# Patient Record
Sex: Female | Born: 2000 | Hispanic: No | Marital: Single | Smoking: Never smoker
Health system: Southern US, Community
[De-identification: ages and names within clinical notes are randomized; demographics above are authoritative.]

## PROBLEM LIST (undated history)

## (undated) DIAGNOSIS — S060X9A Concussion with loss of consciousness of unspecified duration, initial encounter: Secondary | ICD-10-CM

## (undated) DIAGNOSIS — D649 Anemia, unspecified: Secondary | ICD-10-CM

## (undated) DIAGNOSIS — F419 Anxiety disorder, unspecified: Secondary | ICD-10-CM

## (undated) DIAGNOSIS — R55 Syncope and collapse: Secondary | ICD-10-CM

## (undated) DIAGNOSIS — T7840XA Allergy, unspecified, initial encounter: Secondary | ICD-10-CM

## (undated) DIAGNOSIS — N83209 Unspecified ovarian cyst, unspecified side: Secondary | ICD-10-CM

## (undated) DIAGNOSIS — O24419 Gestational diabetes mellitus in pregnancy, unspecified control: Secondary | ICD-10-CM

## (undated) DIAGNOSIS — B999 Unspecified infectious disease: Secondary | ICD-10-CM

## (undated) DIAGNOSIS — E119 Type 2 diabetes mellitus without complications: Secondary | ICD-10-CM

## (undated) DIAGNOSIS — S060XAA Concussion with loss of consciousness status unknown, initial encounter: Secondary | ICD-10-CM

## (undated) HISTORY — DX: Allergy, unspecified, initial encounter: T78.40XA

## (undated) HISTORY — PX: NO PAST SURGERIES: SHX2092

---
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
## 2021-01-28 DIAGNOSIS — Z419 Encounter for procedure for purposes other than remedying health state, unspecified: Secondary | ICD-10-CM | POA: Diagnosis not present

## 2021-02-28 DIAGNOSIS — Z419 Encounter for procedure for purposes other than remedying health state, unspecified: Secondary | ICD-10-CM | POA: Diagnosis not present

## 2021-03-31 DIAGNOSIS — Z419 Encounter for procedure for purposes other than remedying health state, unspecified: Secondary | ICD-10-CM | POA: Diagnosis not present

## 2021-04-30 DIAGNOSIS — Z419 Encounter for procedure for purposes other than remedying health state, unspecified: Secondary | ICD-10-CM | POA: Diagnosis not present

## 2021-05-31 DIAGNOSIS — Z419 Encounter for procedure for purposes other than remedying health state, unspecified: Secondary | ICD-10-CM | POA: Diagnosis not present

## 2021-06-30 DIAGNOSIS — Z419 Encounter for procedure for purposes other than remedying health state, unspecified: Secondary | ICD-10-CM | POA: Diagnosis not present

## 2021-07-31 DIAGNOSIS — Z419 Encounter for procedure for purposes other than remedying health state, unspecified: Secondary | ICD-10-CM | POA: Diagnosis not present

## 2021-08-31 DIAGNOSIS — Z419 Encounter for procedure for purposes other than remedying health state, unspecified: Secondary | ICD-10-CM | POA: Diagnosis not present

## 2021-09-03 IMAGING — US US BREAST*R* LIMITED INC AXILLA
1 series · 5 of 5 positions shown · non-contrast
Comparison: Previous exam(s).

CLINICAL DATA: Six-month follow-up for a likely benign right breast
mass

EXAM:
ULTRASOUND OF THE RIGHT BREAST

[Series 1: us breast*right* limited inc axilla · 0.07mm/px · 5 of 5 slices shown]
[im 1/5]
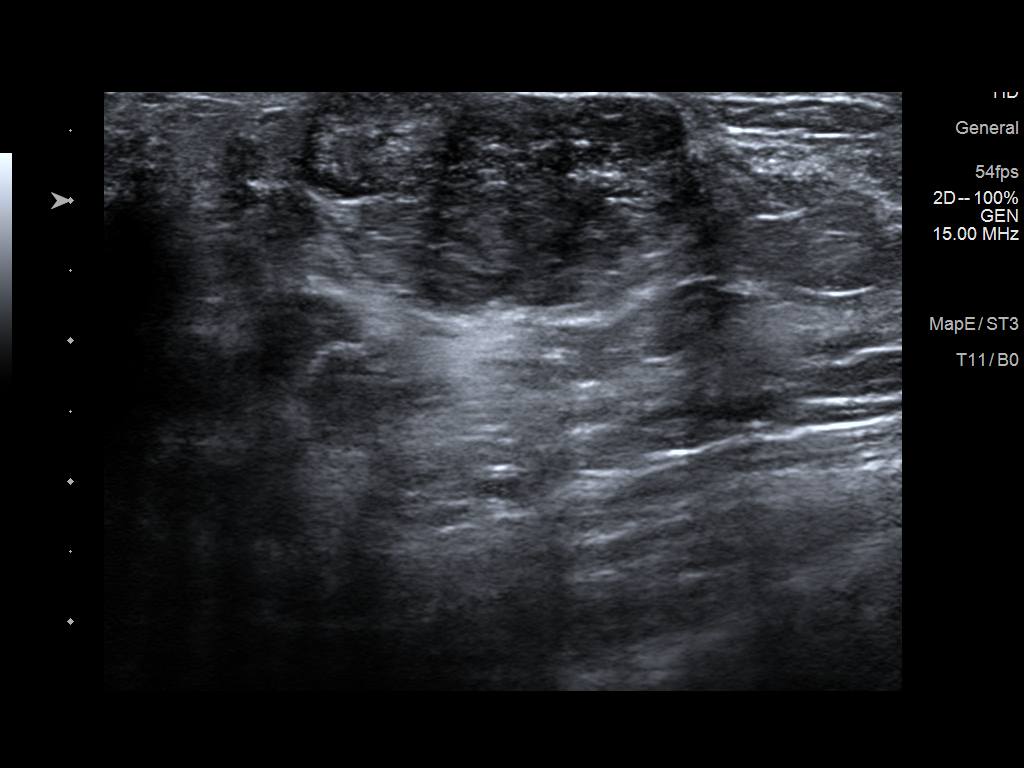
[im 2/5]
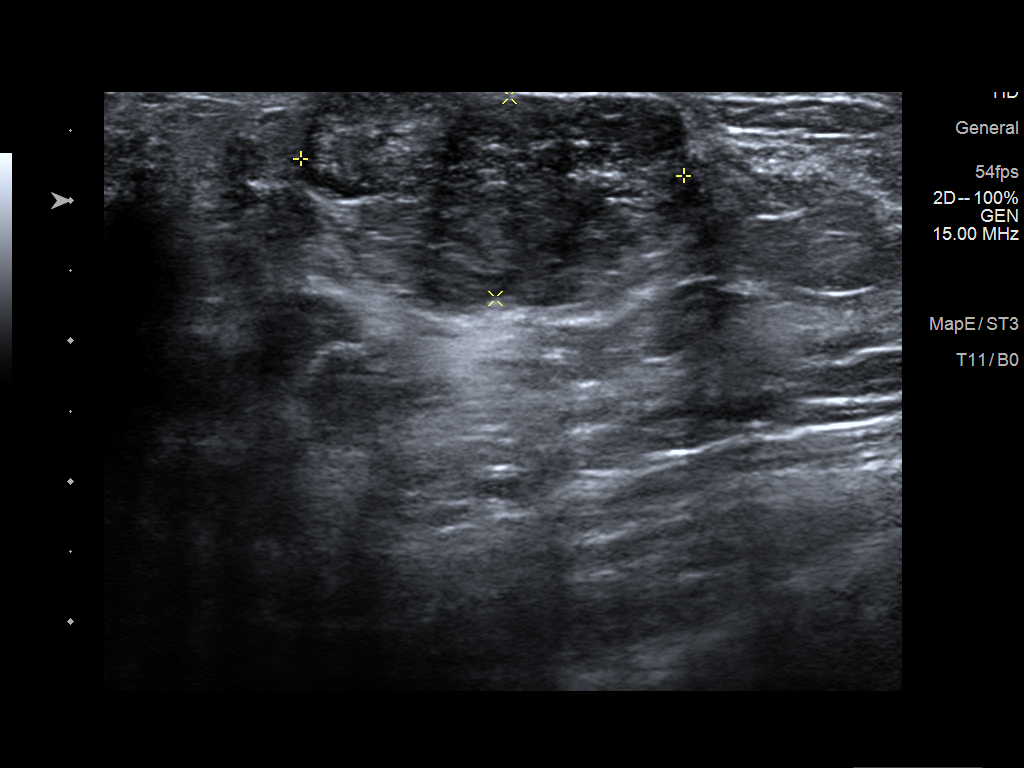
[im 3/5]
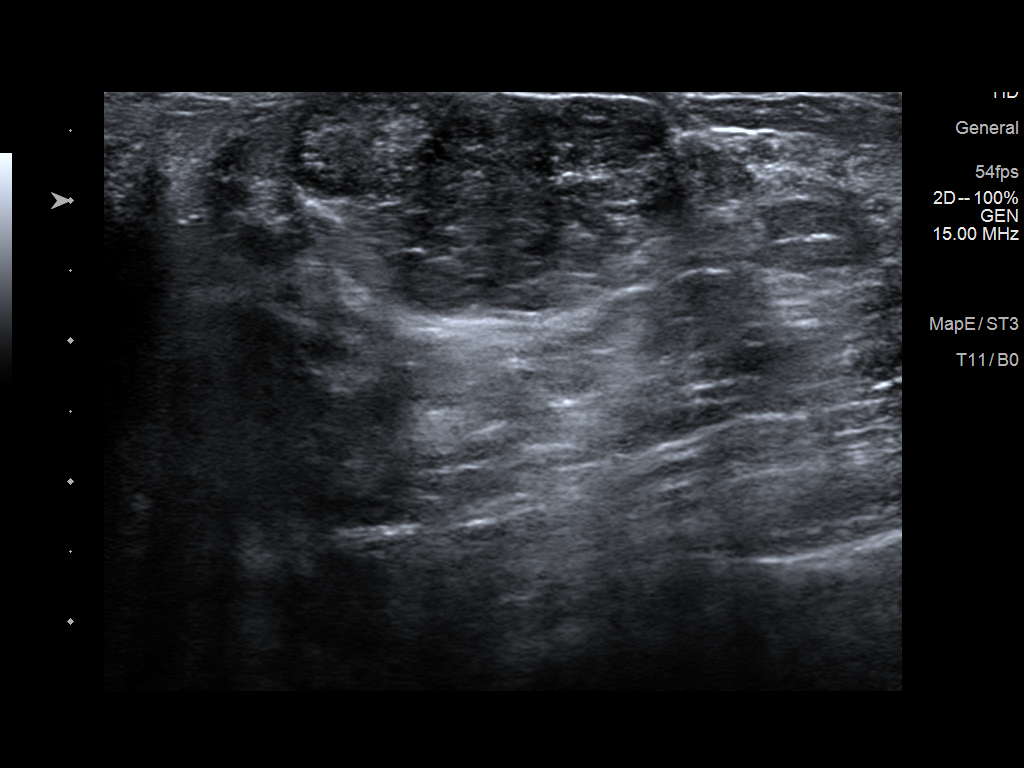
[im 4/5]
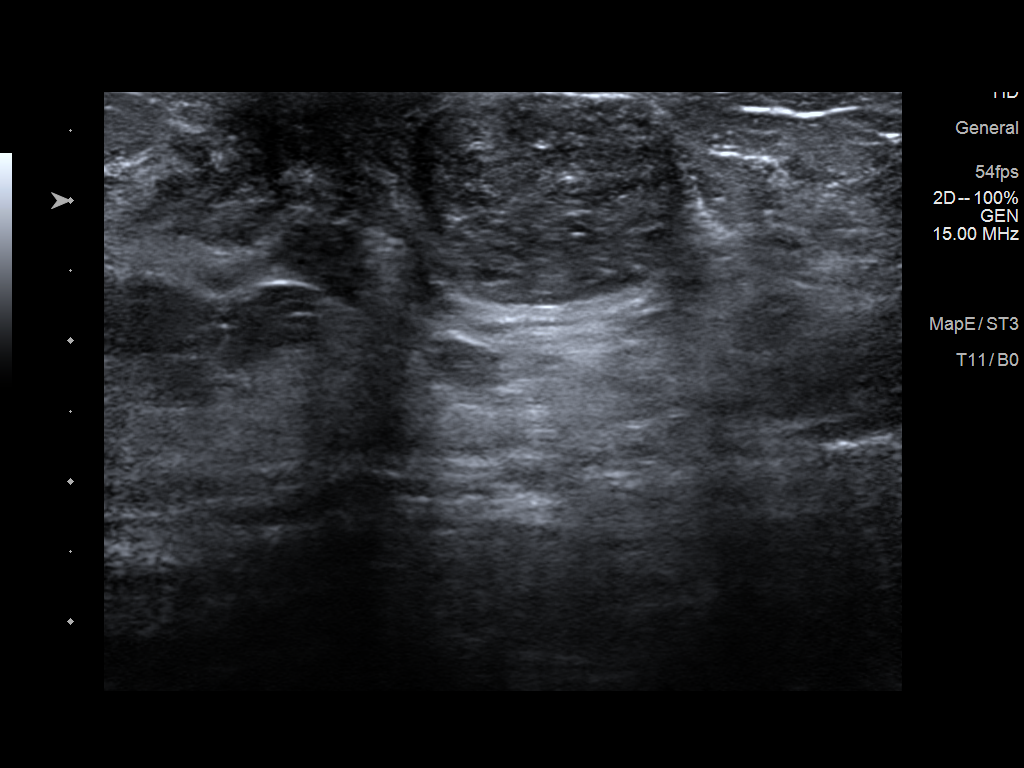
[im 5/5]
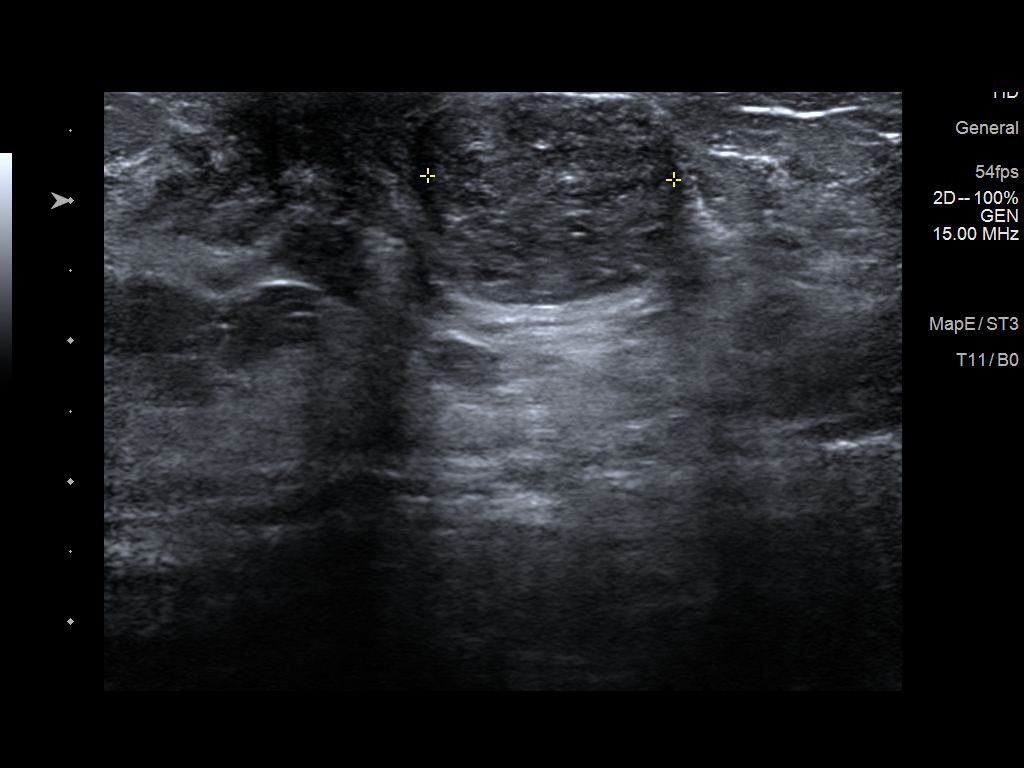

[5 of 5 positions shown; findings below may reference images not displayed]

FINDINGS: Ultrasound of the retroareolar right breast at 6 o'clock
demonstrates a stable oval circumscribed hypoechoic mass measuring
2.7 x 1.4 x 1.8 cm, previously 2.5 x 1.3 x 1.7 cm.
IMPRESSION: The likely benign retroareolar right breast mass at 6 o'clock is
stable.

RECOMMENDATION:
Six-month follow-up right breast ultrasound.

I have discussed the findings and recommendations with the patient.
If applicable, a reminder letter will be sent to the patient
regarding the next appointment.

BI-RADS CATEGORY  3: Probably benign.

## 2021-09-28 DIAGNOSIS — Z419 Encounter for procedure for purposes other than remedying health state, unspecified: Secondary | ICD-10-CM | POA: Diagnosis not present

## 2021-10-29 DIAGNOSIS — Z419 Encounter for procedure for purposes other than remedying health state, unspecified: Secondary | ICD-10-CM | POA: Diagnosis not present

## 2021-11-28 DIAGNOSIS — Z419 Encounter for procedure for purposes other than remedying health state, unspecified: Secondary | ICD-10-CM | POA: Diagnosis not present

## 2021-12-29 DIAGNOSIS — Z419 Encounter for procedure for purposes other than remedying health state, unspecified: Secondary | ICD-10-CM | POA: Diagnosis not present

## 2022-01-28 DIAGNOSIS — Z419 Encounter for procedure for purposes other than remedying health state, unspecified: Secondary | ICD-10-CM | POA: Diagnosis not present

## 2022-02-28 DIAGNOSIS — Z419 Encounter for procedure for purposes other than remedying health state, unspecified: Secondary | ICD-10-CM | POA: Diagnosis not present

## 2022-03-31 DIAGNOSIS — Z419 Encounter for procedure for purposes other than remedying health state, unspecified: Secondary | ICD-10-CM | POA: Diagnosis not present

## 2022-04-30 DIAGNOSIS — Z419 Encounter for procedure for purposes other than remedying health state, unspecified: Secondary | ICD-10-CM | POA: Diagnosis not present

## 2022-05-19 DIAGNOSIS — R051 Acute cough: Secondary | ICD-10-CM | POA: Diagnosis not present

## 2022-05-19 DIAGNOSIS — B349 Viral infection, unspecified: Secondary | ICD-10-CM | POA: Diagnosis not present

## 2022-05-31 DIAGNOSIS — Z419 Encounter for procedure for purposes other than remedying health state, unspecified: Secondary | ICD-10-CM | POA: Diagnosis not present

## 2022-09-29 DIAGNOSIS — Z419 Encounter for procedure for purposes other than remedying health state, unspecified: Secondary | ICD-10-CM | POA: Diagnosis not present

## 2022-10-30 DIAGNOSIS — Z419 Encounter for procedure for purposes other than remedying health state, unspecified: Secondary | ICD-10-CM | POA: Diagnosis not present

## 2022-11-29 DIAGNOSIS — Z419 Encounter for procedure for purposes other than remedying health state, unspecified: Secondary | ICD-10-CM | POA: Diagnosis not present

## 2022-12-30 DIAGNOSIS — Z419 Encounter for procedure for purposes other than remedying health state, unspecified: Secondary | ICD-10-CM | POA: Diagnosis not present

## 2023-01-29 DIAGNOSIS — Z419 Encounter for procedure for purposes other than remedying health state, unspecified: Secondary | ICD-10-CM | POA: Diagnosis not present

## 2023-03-01 DIAGNOSIS — Z419 Encounter for procedure for purposes other than remedying health state, unspecified: Secondary | ICD-10-CM | POA: Diagnosis not present

## 2023-04-01 DIAGNOSIS — Z419 Encounter for procedure for purposes other than remedying health state, unspecified: Secondary | ICD-10-CM | POA: Diagnosis not present

## 2023-05-01 DIAGNOSIS — Z419 Encounter for procedure for purposes other than remedying health state, unspecified: Secondary | ICD-10-CM | POA: Diagnosis not present

## 2023-06-01 DIAGNOSIS — Z419 Encounter for procedure for purposes other than remedying health state, unspecified: Secondary | ICD-10-CM | POA: Diagnosis not present

## 2023-07-01 DIAGNOSIS — Z419 Encounter for procedure for purposes other than remedying health state, unspecified: Secondary | ICD-10-CM | POA: Diagnosis not present

## 2023-08-01 DIAGNOSIS — Z419 Encounter for procedure for purposes other than remedying health state, unspecified: Secondary | ICD-10-CM | POA: Diagnosis not present

## 2023-09-01 DIAGNOSIS — Z419 Encounter for procedure for purposes other than remedying health state, unspecified: Secondary | ICD-10-CM | POA: Diagnosis not present

## 2023-09-29 DIAGNOSIS — Z419 Encounter for procedure for purposes other than remedying health state, unspecified: Secondary | ICD-10-CM | POA: Diagnosis not present

## 2023-11-10 DIAGNOSIS — Z419 Encounter for procedure for purposes other than remedying health state, unspecified: Secondary | ICD-10-CM | POA: Diagnosis not present

## 2023-11-15 ENCOUNTER — Ambulatory Visit (HOSPITAL_BASED_OUTPATIENT_CLINIC_OR_DEPARTMENT_OTHER): Admitting: Student

## 2023-11-15 ENCOUNTER — Encounter (HOSPITAL_BASED_OUTPATIENT_CLINIC_OR_DEPARTMENT_OTHER): Payer: Self-pay | Admitting: Student

## 2023-11-15 VITALS — BP 124/88 | HR 96 | Temp 98.2°F | Ht 66.54 in | Wt 157.7 lb

## 2023-11-15 DIAGNOSIS — Z136 Encounter for screening for cardiovascular disorders: Secondary | ICD-10-CM

## 2023-11-15 DIAGNOSIS — Z7689 Persons encountering health services in other specified circumstances: Secondary | ICD-10-CM

## 2023-11-15 DIAGNOSIS — Z8632 Personal history of gestational diabetes: Secondary | ICD-10-CM

## 2023-11-15 DIAGNOSIS — Z131 Encounter for screening for diabetes mellitus: Secondary | ICD-10-CM

## 2023-11-15 DIAGNOSIS — R4681 Obsessive-compulsive behavior: Secondary | ICD-10-CM | POA: Diagnosis not present

## 2023-11-15 DIAGNOSIS — N6341 Unspecified lump in right breast, subareolar: Secondary | ICD-10-CM

## 2023-11-15 DIAGNOSIS — Z1329 Encounter for screening for other suspected endocrine disorder: Secondary | ICD-10-CM | POA: Diagnosis not present

## 2023-11-15 DIAGNOSIS — Z13220 Encounter for screening for lipoid disorders: Secondary | ICD-10-CM

## 2023-11-15 NOTE — Assessment & Plan Note (Signed)
 A breast mass is present behind the right nipple, exhibiting growth since the last imaging in October 2021. It is lumpy, irregular, and minimally mobile, with no associated pain or nipple discharge. Previous imaging recommended follow-up, which was not completed in 2022 and 2023. Given her age and breast tissue density, ultrasound is preferred, but a diagnostic mammogram is also an option. The mass's growth necessitates further imaging to assess its nature, with the possibility of it being benign if it has some mobility. - Perform breast examination to assess mobility and characteristics of the mass. - Order imaging (ultrasound or diagnostic mammogram) to evaluate the breast mass.

## 2023-11-15 NOTE — Assessment & Plan Note (Signed)
 She experiences obsessive thoughts and minor compulsions, such as checking locks and social media interactions, which began about a year ago and have improved. The symptoms are not significantly interfering with daily life, and she does not feel the need for medication. Her brother has similar symptoms, suggesting a possible familial component. Discussed the nature of obsessive-compulsive symptoms and the potential for exposure therapy to help manage them. Emphasized that symptoms become pathologic when they interfere with daily life. - Monitor symptoms and consider exposure therapy for obsessive thoughts and compulsions. - Reassess if symptoms worsen or interfere with daily life.

## 2023-11-15 NOTE — Progress Notes (Signed)
 New Patient Office Visit  Subjective    Patient ID: Brenda [name], female    DOB: [birth year]  Age: 23 y.o. MRN: [id number]  CC:  Chief Complaint  Patient presents with   Establish Care    Pt. Here to establish care.    Breast Mass    Pt. C/o a breast mass that's on the Rt. Breast with no pain that was noticed in 2020.     Discussed the use of AI scribe software for clinical note transcription with the patient, who gave verbal consent to proceed.  History of Present Illness   Brenda [name] presents to establish care and for evaluation of breast mass. Prior PCP was Dr. Lowella Dell. Last physical was 2021. she notes that she requires refills of no medications.  She has a breast mass located behind the nipple, which has increased in size since the last imaging in October 2021. The mass is lumpy with one end being sharp and is not painful. Initially, the mass was monitored with imaging every six months, then annually, but follow-up imaging has not occurred since October 2021. No nipple discharge or changes in mass size related to her menstrual cycle.  There is a significant family history of breast cancer, including her maternal grandmother, maternal great aunts, and unspecified relatives on her father's side. She is unsure if any family members have been tested for the BRCA gene.  She has a history of gestational diabetes. No history of high cholesterol or panic attacks. She experiences some obsessive thoughts and compulsive behaviors, such as repeatedly checking social media interactions and concerns about safety, which have improved over the past year. Her brother has similar but more severe symptoms.  She is not currently on any prescribed medications, only using over-the-counter medications like Tylenol and ibuprofen as needed.   Screenings:  Colon Cancer: No fmh Lung Cancer: pgm- with history of smoking. No smoking. Breast Cancer: MGM, two maternal great aunts, some on paternal  side. Cervical Cancer: No cervical cancer in the [facility].  Diabetes: History of gestational diabetes  HLD: No hld    Outpatient Encounter Medications as of 11/15/2023  Medication Sig   acetaminophen (TYLENOL) 325 MG tablet Take 2 tablets (650 mg total) by mouth every 4 (four) hours as needed (for pain scale < 4).   ibuprofen (ADVIL) 600 MG tablet Take 1 tablet (600 mg total) by mouth every 6 (six) hours.   Prenatal Vit-Fe Fumarate-FA (MULTIVITAMIN-PRENATAL) 27-0.8 MG TABS tablet Take 1 tablet by mouth 3 (three) times a week.  (Patient not taking: Reported on 06/16/2020)   No facility-administered encounter medications on file as of 11/15/2023.    Past Medical History:  Diagnosis Date   Allergy    Anemia    Anxiety    Concussion    Diabetes mellitus without complication (HCC)    Gestational diabetes    Infection    UTI   Ovarian cyst    Syncope     Past Surgical History:  Procedure Laterality Date   NO PAST SURGERIES      Family History  Problem Relation Age of Onset   Heart disease Paternal Grandfather 60       heart attack   Diabetes Paternal Grandfather    ADD / ADHD Brother    Migraines Brother    Kidney disease Mother        kidney stone   Anxiety disorder Mother    Hypertension Paternal Aunt    Cancer Maternal Grandmother  skin cancer   Migraines Maternal Grandmother    Heart disease Maternal Grandfather 49       heart attack   Hyperlipidemia Maternal Grandfather    Arthritis Paternal Grandmother    Seizures Father    Anxiety disorder Father    Depression Father    Bipolar disorder Father    Cerebral aneurysm Father    Migraines Maternal Uncle    Autism Neg Hx    Schizophrenia Neg Hx     Social History   Socioeconomic History   Marital status: Single    Spouse name: Not on file   Number of children: Not on file   Years of education: Not on file   Highest education level: Not on file  Occupational History   Not on file  Tobacco Use    Smoking status: Never   Smokeless tobacco: Never  Vaping Use   Vaping status: Never Used  Substance and Sexual Activity   Alcohol use: No   Drug use: No   Sexual activity: Yes    Birth control/protection: None  Other Topics Concern   Not on file  Social History Narrative   Lives at home with mother, younger brother, stepfather and stepsister.   Has graduated from high school.   Works at [employer].   Social Drivers of Corporate investment banker Strain: Not on file  Food Insecurity: Not on file  Transportation Needs: Not on file  Physical Activity: Not on file  Stress: Not on file  Social Connections: Not on file  Intimate Partner Violence: Not on file    ROS  Per HPI      Objective    BP 124/88   Pulse 96   Temp 98.2 F (36.8 C) (Oral)   Ht 5' 6.54" (1.69 m)   Wt 157 lb 11.2 oz (71.5 kg)   LMP 11/06/2023 (Exact Date)   SpO2 95%   Breastfeeding No   BMI 25.05 kg/m   Physical Exam Constitutional:      General: She is not in acute distress.    Appearance: Normal appearance. She is not ill-appearing.  HENT:     Head: Normocephalic and atraumatic.     Right Ear: External ear normal.     Left Ear: External ear normal.     Nose: Nose normal.     Mouth/Throat:     Mouth: Mucous membranes are moist.     Pharynx: Oropharynx is clear.  Eyes:     General: No scleral icterus.    Extraocular Movements: Extraocular movements intact.     Conjunctiva/sclera: Conjunctivae normal.     Pupils: Pupils are equal, round, and reactive to light.  Neck:     Vascular: No carotid bruit.  Cardiovascular:     Rate and Rhythm: Normal rate and regular rhythm.     Pulses: Normal pulses.     Heart sounds: Normal heart sounds. No murmur heard.    No friction rub.  Pulmonary:     Effort: Pulmonary effort is normal. No respiratory distress.     Breath sounds: Normal breath sounds. No wheezing, rhonchi or rales.  Chest:  Breasts:    Right: Mass present. No swelling, bleeding,  inverted nipple, nipple discharge, skin change or tenderness.       Comments: Subareolar at 6 o clock position Musculoskeletal:        General: Normal range of motion.     Cervical back: Neck supple.     Right lower leg: No edema.  Left lower leg: No edema.  Skin:    General: Skin is warm and dry.     Coloration: Skin is not jaundiced or pale.  Neurological:     General: No focal deficit present.     Mental Status: She is alert.     Deep Tendon Reflexes: Reflexes normal.  Psychiatric:        Mood and Affect: Mood normal.        Behavior: Behavior normal.         Assessment & Plan:   Encounter to establish care -     CBC with Differential/Platelet -     Comprehensive metabolic panel with GFR  Encounter for lipid screening for cardiovascular disease -     Lipid panel  Screening for diabetes mellitus -     Hemoglobin A1c  Subareolar mass of right breast Assessment & Plan: A breast mass is present behind the right nipple, exhibiting growth since the last imaging in October 2021. It is lumpy, irregular, and minimally mobile, with no associated pain or nipple discharge. Previous imaging recommended follow-up, which was not completed in 2022 and 2023. Given her age and breast tissue density, ultrasound is preferred, but a diagnostic mammogram is also an option. The mass's growth necessitates further imaging to assess its nature, with the possibility of it being benign if it has some mobility. - Perform breast examination to assess mobility and characteristics of the mass. - Order imaging (ultrasound or diagnostic mammogram) to evaluate the breast mass.  Orders: -     CBC with Differential/Platelet -     Comprehensive metabolic panel with GFR -     US  LIMITED ULTRASOUND INCLUDING AXILLA RIGHT BREAST; Future  History of gestational diabetes -     CBC with Differential/Platelet -     Comprehensive metabolic panel with GFR  Obsessive behavior Assessment & Plan: She  experiences obsessive thoughts and minor compulsions, such as checking locks and social media interactions, which began about a year ago and have improved. The symptoms are not significantly interfering with daily life, and she does not feel the need for medication. Her brother has similar symptoms, suggesting a possible familial component. Discussed the nature of obsessive-compulsive symptoms and the potential for exposure therapy to help manage them. Emphasized that symptoms become pathologic when they interfere with daily life. - Monitor symptoms and consider exposure therapy for obsessive thoughts and compulsions. - Reassess if symptoms worsen or interfere with daily life.   General Health Maintenance She has not had a Pap smear and is advised to have one this year. Discussed the importance of the HPV vaccine, which she may need to complete. She has a family history of breast cancer and lung cancer, and the importance of regular screenings and preventive measures was discussed. The HPV vaccine is highlighted as one of the few vaccinations that can prevent cancer. - Encourage scheduling a Pap smear this year. - Review vaccination records and administer HPV vaccine if due. - Perform fasting lab work today.     Return in about 3 months (around 02/14/2024) for Annual Physical.   Christon Parada T Nassim Cosma, PA-C

## 2023-11-15 NOTE — Patient Instructions (Signed)
 It was nice to see you today!  As we discussed in clinic:   I will send in your breast ultrasound to check on the mass in your right breast- they do these at the [facility]  If you have any problems before your next visit feel free to message me via MyChart (minor issues or questions) or call the office, otherwise you may reach out to schedule an office visit.  Thank you! Rudolph Daoust, PA-C

## 2023-11-16 LAB — TSH: TSH: 0.878 u[IU]/mL (ref 0.450–4.500)

## 2023-11-16 LAB — HEMOGLOBIN A1C
Est. average glucose Bld gHb Est-mCnc: 105 mg/dL
Hgb A1c MFr Bld: 5.3 % (ref 4.8–5.6)

## 2023-11-16 LAB — CBC WITH DIFFERENTIAL/PLATELET
Basophils Absolute: 0.1 10*3/uL (ref 0.0–0.2)
Basos: 1 %
EOS (ABSOLUTE): 0.1 10*3/uL (ref 0.0–0.4)
Eos: 1 %
Hematocrit: 42.2 % (ref 34.0–46.6)
Hemoglobin: 14.1 g/dL (ref 11.1–15.9)
Immature Grans (Abs): 0 10*3/uL (ref 0.0–0.1)
Immature Granulocytes: 0 %
Lymphocytes Absolute: 1.9 10*3/uL (ref 0.7–3.1)
Lymphs: 27 %
MCH: 29.1 pg (ref 26.6–33.0)
MCHC: 33.4 g/dL (ref 31.5–35.7)
MCV: 87 fL (ref 79–97)
Monocytes Absolute: 0.5 10*3/uL (ref 0.1–0.9)
Monocytes: 7 %
Neutrophils Absolute: 4.6 10*3/uL (ref 1.4–7.0)
Neutrophils: 64 %
Platelets: 334 10*3/uL (ref 150–450)
RBC: 4.84 x10E6/uL (ref 3.77–5.28)
RDW: 12.3 % (ref 11.7–15.4)
WBC: 7.1 10*3/uL (ref 3.4–10.8)

## 2023-11-16 LAB — COMPREHENSIVE METABOLIC PANEL WITH GFR
ALT: 17 IU/L (ref 0–32)
AST: 23 IU/L (ref 0–40)
Albumin: 4.8 g/dL (ref 4.0–5.0)
Alkaline Phosphatase: 111 IU/L (ref 44–121)
BUN/Creatinine Ratio: 11 (ref 9–23)
BUN: 9 mg/dL (ref 6–20)
Bilirubin Total: 0.2 mg/dL (ref 0.0–1.2)
CO2: 21 mmol/L (ref 20–29)
Calcium: 10 mg/dL (ref 8.7–10.2)
Chloride: 104 mmol/L (ref 96–106)
Creatinine, Ser: 0.82 mg/dL (ref 0.57–1.00)
Globulin, Total: 2.3 g/dL (ref 1.5–4.5)
Glucose: 78 mg/dL (ref 70–99)
Potassium: 5 mmol/L (ref 3.5–5.2)
Sodium: 141 mmol/L (ref 134–144)
Total Protein: 7.1 g/dL (ref 6.0–8.5)
eGFR: 104 mL/min/{1.73_m2} (ref >59)

## 2023-11-16 LAB — LIPID PANEL
Chol/HDL Ratio: 2.2 ratio (ref 0.0–4.4)
Cholesterol, Total: 127 mg/dL (ref 100–199)
HDL: 57 mg/dL (ref >39)
LDL Chol Calc (NIH): 61 mg/dL (ref 0–99)
Triglycerides: 35 mg/dL (ref 0–149)
VLDL Cholesterol Cal: 9 mg/dL (ref 5–40)

## 2023-11-16 LAB — T4, FREE: Free T4: 1.14 ng/dL (ref 0.82–1.77)

## 2023-11-17 ENCOUNTER — Encounter (HOSPITAL_BASED_OUTPATIENT_CLINIC_OR_DEPARTMENT_OTHER): Payer: Self-pay | Admitting: Student

## 2023-11-19 ENCOUNTER — Other Ambulatory Visit

## 2023-12-07 ENCOUNTER — Encounter (HOSPITAL_BASED_OUTPATIENT_CLINIC_OR_DEPARTMENT_OTHER): Payer: Self-pay | Admitting: Student

## 2023-12-07 ENCOUNTER — Ambulatory Visit
Admission: RE | Admit: 2023-12-07 | Discharge: 2023-12-07 | Disposition: A | Discharge status: Home / Self Care | Source: Ambulatory Visit | Attending: Student

## 2023-12-07 DIAGNOSIS — N6341 Unspecified lump in right breast, subareolar: Secondary | ICD-10-CM

## 2023-12-07 DIAGNOSIS — N6315 Unspecified lump in the right breast, overlapping quadrants: Secondary | ICD-10-CM | POA: Diagnosis not present

## 2023-12-10 DIAGNOSIS — Z419 Encounter for procedure for purposes other than remedying health state, unspecified: Secondary | ICD-10-CM | POA: Diagnosis not present

## 2024-01-10 DIAGNOSIS — Z419 Encounter for procedure for purposes other than remedying health state, unspecified: Secondary | ICD-10-CM | POA: Diagnosis not present

## 2024-02-09 DIAGNOSIS — Z419 Encounter for procedure for purposes other than remedying health state, unspecified: Secondary | ICD-10-CM | POA: Diagnosis not present

## 2024-02-14 ENCOUNTER — Encounter (HOSPITAL_BASED_OUTPATIENT_CLINIC_OR_DEPARTMENT_OTHER): Admitting: Student

## 2024-03-11 DIAGNOSIS — Z419 Encounter for procedure for purposes other than remedying health state, unspecified: Secondary | ICD-10-CM | POA: Diagnosis not present

## 2024-04-01 DIAGNOSIS — Z369 Encounter for antenatal screening, unspecified: Secondary | ICD-10-CM | POA: Diagnosis not present

## 2024-04-01 DIAGNOSIS — Z3A09 9 weeks gestation of pregnancy: Secondary | ICD-10-CM | POA: Diagnosis not present

## 2024-04-01 DIAGNOSIS — O3680X0 Pregnancy with inconclusive fetal viability, not applicable or unspecified: Secondary | ICD-10-CM | POA: Diagnosis not present

## 2024-04-01 DIAGNOSIS — Z3481 Encounter for supervision of other normal pregnancy, first trimester: Secondary | ICD-10-CM | POA: Diagnosis not present

## 2024-04-01 DIAGNOSIS — Z3689 Encounter for other specified antenatal screening: Secondary | ICD-10-CM | POA: Diagnosis not present

## 2024-04-01 DIAGNOSIS — Z124 Encounter for screening for malignant neoplasm of cervix: Secondary | ICD-10-CM | POA: Diagnosis not present

## 2024-04-01 DIAGNOSIS — Z8632 Personal history of gestational diabetes: Secondary | ICD-10-CM | POA: Diagnosis not present

## 2024-04-11 DIAGNOSIS — Z419 Encounter for procedure for purposes other than remedying health state, unspecified: Secondary | ICD-10-CM | POA: Diagnosis not present

## 2024-04-29 DIAGNOSIS — Z3481 Encounter for supervision of other normal pregnancy, first trimester: Secondary | ICD-10-CM | POA: Diagnosis not present

## 2024-04-29 DIAGNOSIS — Z3689 Encounter for other specified antenatal screening: Secondary | ICD-10-CM | POA: Diagnosis not present

## 2024-04-29 DIAGNOSIS — Z8632 Personal history of gestational diabetes: Secondary | ICD-10-CM | POA: Diagnosis not present

## 2024-05-28 DIAGNOSIS — Z369 Encounter for antenatal screening, unspecified: Secondary | ICD-10-CM | POA: Diagnosis not present

## 2024-05-28 DIAGNOSIS — Z3482 Encounter for supervision of other normal pregnancy, second trimester: Secondary | ICD-10-CM | POA: Diagnosis not present

## 2024-05-28 DIAGNOSIS — Z3A17 17 weeks gestation of pregnancy: Secondary | ICD-10-CM | POA: Diagnosis not present

## 2024-05-28 DIAGNOSIS — Z3689 Encounter for other specified antenatal screening: Secondary | ICD-10-CM | POA: Diagnosis not present

## 2024-06-25 DIAGNOSIS — O24410 Gestational diabetes mellitus in pregnancy, diet controlled: Secondary | ICD-10-CM | POA: Diagnosis not present

## 2024-06-25 DIAGNOSIS — Z363 Encounter for antenatal screening for malformations: Secondary | ICD-10-CM | POA: Diagnosis not present

## 2024-06-25 DIAGNOSIS — Z3A21 21 weeks gestation of pregnancy: Secondary | ICD-10-CM | POA: Diagnosis not present

## 2024-07-22 DIAGNOSIS — R0982 Postnasal drip: Secondary | ICD-10-CM | POA: Diagnosis not present

## 2024-07-22 DIAGNOSIS — R0981 Nasal congestion: Secondary | ICD-10-CM | POA: Diagnosis not present

## 2024-07-22 DIAGNOSIS — B348 Other viral infections of unspecified site: Secondary | ICD-10-CM | POA: Diagnosis not present

## 2024-07-22 DIAGNOSIS — R5381 Other malaise: Secondary | ICD-10-CM | POA: Diagnosis not present

## 2024-07-22 DIAGNOSIS — R051 Acute cough: Secondary | ICD-10-CM | POA: Diagnosis not present

## 2024-07-25 DIAGNOSIS — O24410 Gestational diabetes mellitus in pregnancy, diet controlled: Secondary | ICD-10-CM | POA: Diagnosis not present

## 2024-07-25 DIAGNOSIS — Z3A25 25 weeks gestation of pregnancy: Secondary | ICD-10-CM | POA: Diagnosis not present
